# Patient Record
Sex: Male | Born: 1970 | Race: White | Hispanic: No | Marital: Married | Smoking: Never smoker
Health system: Southern US, Community
[De-identification: ages and names within clinical notes are randomized; demographics above are authoritative.]

## PROBLEM LIST (undated history)

## (undated) DIAGNOSIS — M48061 Spinal stenosis, lumbar region without neurogenic claudication: Secondary | ICD-10-CM

## (undated) DIAGNOSIS — E039 Hypothyroidism, unspecified: Secondary | ICD-10-CM

## (undated) DIAGNOSIS — I10 Essential (primary) hypertension: Secondary | ICD-10-CM

## (undated) DIAGNOSIS — F419 Anxiety disorder, unspecified: Secondary | ICD-10-CM

## (undated) DIAGNOSIS — G8929 Other chronic pain: Secondary | ICD-10-CM

## (undated) DIAGNOSIS — F4310 Post-traumatic stress disorder, unspecified: Secondary | ICD-10-CM

## (undated) DIAGNOSIS — K219 Gastro-esophageal reflux disease without esophagitis: Secondary | ICD-10-CM

## (undated) HISTORY — PX: SPINAL CORD STIMULATOR IMPLANT: SHX2422

## (undated) HISTORY — PX: OTHER SURGICAL HISTORY: SHX169

---

## 2020-04-20 ENCOUNTER — Ambulatory Visit
Admission: EM | Admit: 2020-04-20 | Discharge: 2020-04-20 | Disposition: A | Discharge status: Home / Self Care | Payer: Medicare Other

## 2020-04-20 ENCOUNTER — Other Ambulatory Visit: Payer: Self-pay

## 2020-04-20 ENCOUNTER — Encounter: Payer: Self-pay | Admitting: Internal Medicine

## 2020-04-20 ENCOUNTER — Ambulatory Visit (INDEPENDENT_AMBULATORY_CARE_PROVIDER_SITE_OTHER): Payer: Medicare Other

## 2020-04-20 DIAGNOSIS — R059 Cough, unspecified: Secondary | ICD-10-CM

## 2020-04-20 DIAGNOSIS — R6889 Other general symptoms and signs: Secondary | ICD-10-CM | POA: Diagnosis not present

## 2020-04-20 DIAGNOSIS — R062 Wheezing: Secondary | ICD-10-CM

## 2020-04-20 HISTORY — DX: Spinal stenosis, lumbar region without neurogenic claudication: M48.061

## 2020-04-20 HISTORY — DX: Anxiety disorder, unspecified: F41.9

## 2020-04-20 HISTORY — DX: Essential (primary) hypertension: I10

## 2020-04-20 HISTORY — DX: Other chronic pain: G89.29

## 2020-04-20 HISTORY — DX: Hypothyroidism, unspecified: E03.9

## 2020-04-20 HISTORY — DX: Gastro-esophageal reflux disease without esophagitis: K21.9

## 2020-04-20 HISTORY — DX: Post-traumatic stress disorder, unspecified: F43.10

## 2020-04-20 MED ORDER — PREDNISONE 50 MG PO TABS: ORAL_TABLET | ORAL | 0 refills | Status: AC

## 2020-04-20 MED ORDER — OSELTAMIVIR PHOSPHATE 75 MG PO CAPS: 75.0000 mg | ORAL_CAPSULE | Freq: Two times a day (BID) | ORAL | 0 refills | Status: AC

## 2020-04-20 MED ORDER — ALBUTEROL SULFATE HFA 108 (90 BASE) MCG/ACT IN AERS: 2.0000 | INHALATION_SPRAY | RESPIRATORY_TRACT | 0 refills | Status: AC | PRN

## 2020-04-20 NOTE — Discharge Instructions (Addendum)
I suspect you have covid, but could also be the flu. Your chest xray does not show pneumonia, but your have inflammation in your breathing tubes making you wheeze, so the prednisone and albuterol inhaler will help this. Since we wont get the test back til next week, I will go ahead and start you on tamiflu to cover for the flu   Folow up with your family Dr next week, so your lungs can be re-examined and your oxygen tested.   You may take the following supplements to help your immune system be stronger to fight this viral infection Take Quarcetin 500 mg three times a day x 7 days with Zinc 50 mg ones a day x 7 days. The quarcetin is an antiviral and anti-inflammatory supplement which helps open the zinc channels in the cell to absorb Zinc. Zinc helps decrease the virus load in your body. Take Melatonin 6-10 mg at bed time which also helps support your immune system.  Also make sure to take Vit D 5,000 IU per day with a fatty meal and Vit C 5000 mg a day until you are completely better. To prevent viral illnesses your vitamin D should be between 60-80. Stay on Vitamin D 2,000  and C  1000 mg the rest of the season.  Don't lay around, keep active and walk as much as you are able to to prevent worsening of your symptoms.  Follow up with your family Dr next week.  If you get short of breath and you are able to check  your oxygen with a pulse oxygen meter, if it gets to 92% or less, you need to go to the hospital to be admitted. If you dont have one, come back here and we will assess you.

## 2020-04-20 NOTE — ED Triage Notes (Signed)
Patient states that he has sore throat x 3 days now having cough- production with cough, some low grade fever. chills and sweats
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
## 2022-08-01 IMAGING — DX DG CHEST 2V
2 series · 2 of 2 positions shown · non-contrast
Comparison: None.

CLINICAL DATA: Cough.

EXAM:
CHEST - 2 VIEW

[chest pa]
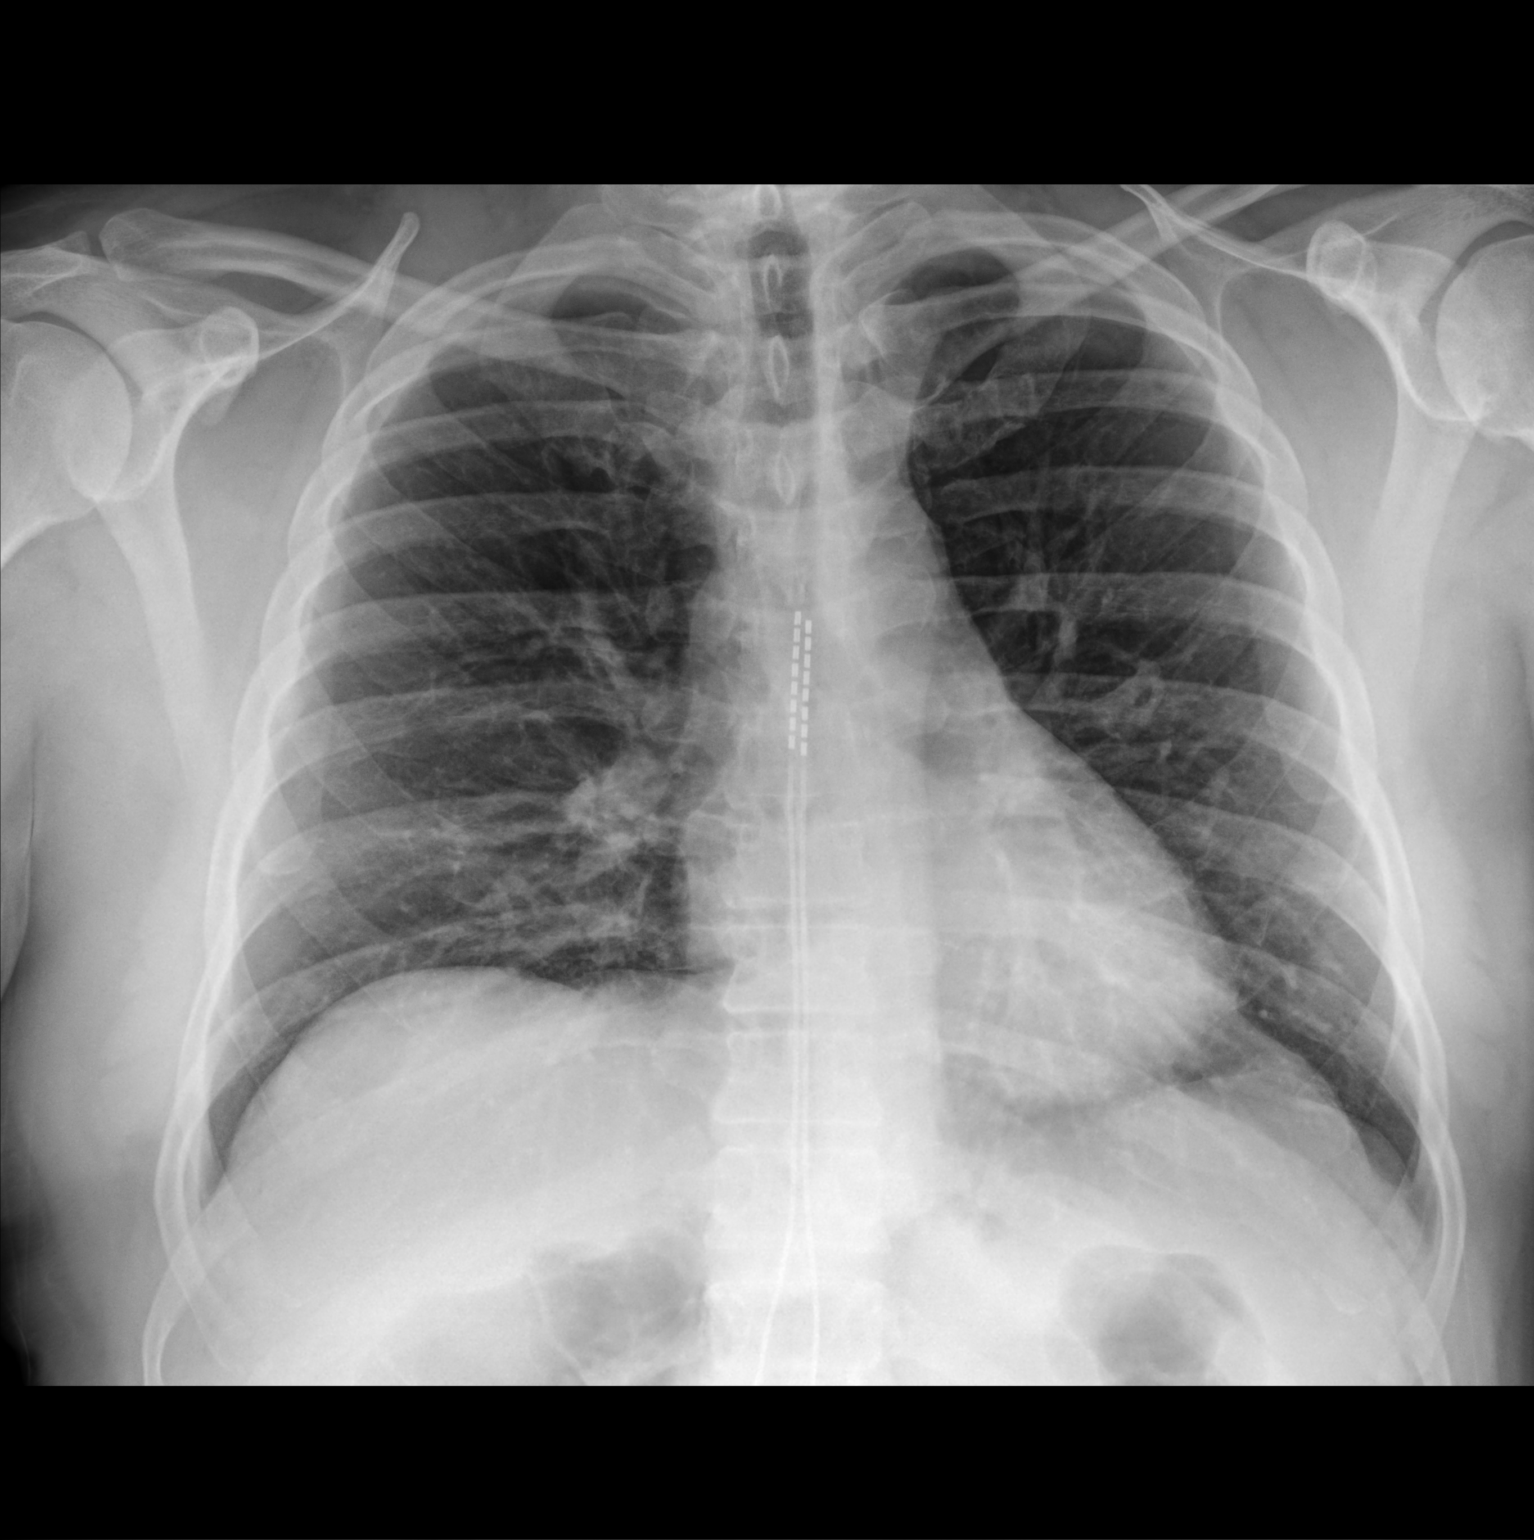

[chest lat]
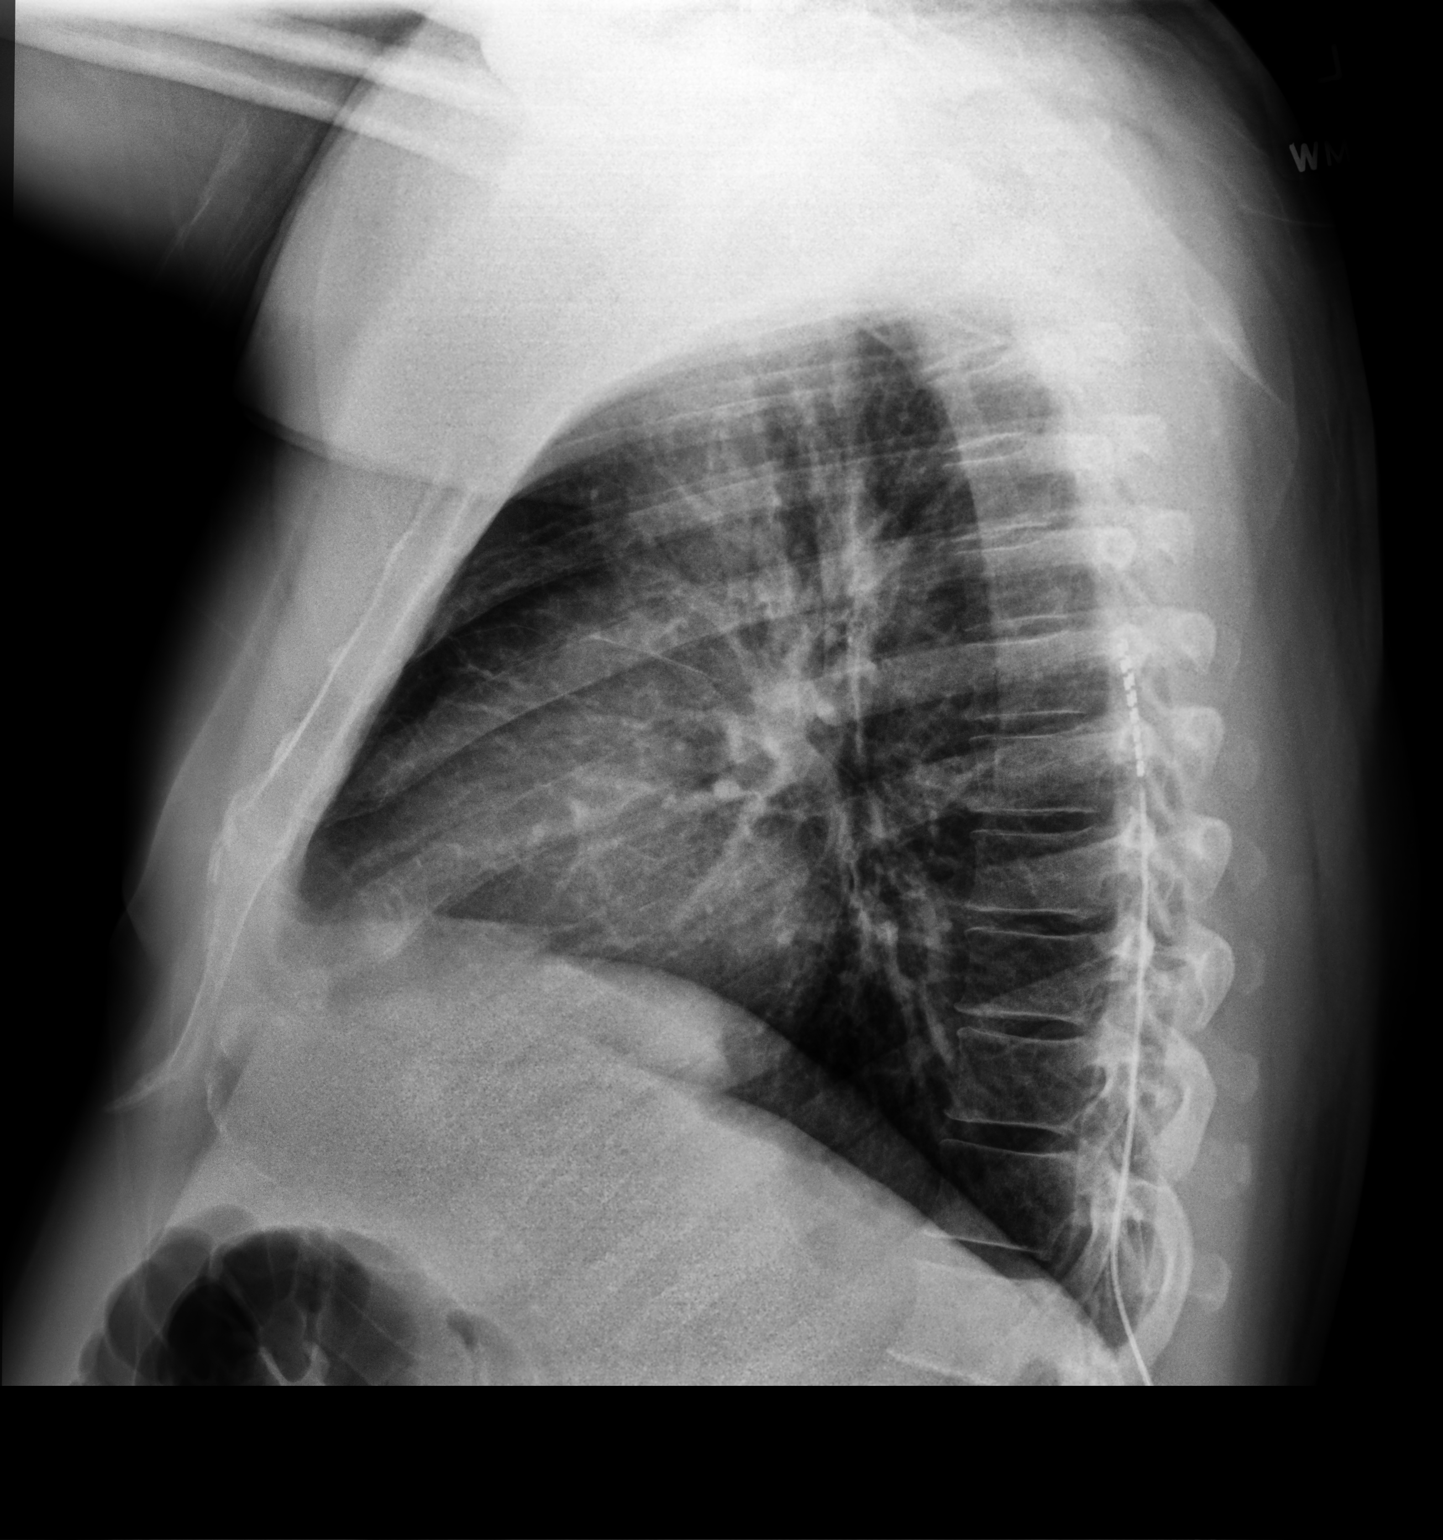

[2 of 2 positions shown; findings below may reference images not displayed]

FINDINGS: The heart size and mediastinal contours are within normal limits.
Both lungs are clear. The visualized skeletal structures are
unremarkable.
IMPRESSION: No active cardiopulmonary disease.
